# Patient Record
Sex: Male | Born: 1945 | Race: White | Hispanic: No | Marital: Married | Smoking: Never smoker
Health system: Southern US, Community
[De-identification: ages and names within clinical notes are randomized; demographics above are authoritative.]

## PROBLEM LIST (undated history)

## (undated) DIAGNOSIS — I10 Essential (primary) hypertension: Secondary | ICD-10-CM

## (undated) DIAGNOSIS — N200 Calculus of kidney: Secondary | ICD-10-CM

## (undated) HISTORY — PX: HOLEP-LASER ENUCLEATION OF THE PROSTATE WITH MORCELLATION: SHX6641

---

## 2014-10-23 HISTORY — PX: KIDNEY STONE SURGERY: SHX686

## 2016-12-16 ENCOUNTER — Emergency Department (HOSPITAL_BASED_OUTPATIENT_CLINIC_OR_DEPARTMENT_OTHER)
Admission: EM | Admit: 2016-12-16 | Discharge: 2016-12-16 | Disposition: A | Discharge status: Home / Self Care | Payer: Medicare Other | Attending: Emergency Medicine | Admitting: Emergency Medicine

## 2016-12-16 ENCOUNTER — Encounter (HOSPITAL_BASED_OUTPATIENT_CLINIC_OR_DEPARTMENT_OTHER): Payer: Self-pay | Admitting: Emergency Medicine

## 2016-12-16 DIAGNOSIS — R339 Retention of urine, unspecified: Secondary | ICD-10-CM | POA: Diagnosis not present

## 2016-12-16 DIAGNOSIS — I10 Essential (primary) hypertension: Secondary | ICD-10-CM | POA: Insufficient documentation

## 2016-12-16 DIAGNOSIS — R1030 Lower abdominal pain, unspecified: Secondary | ICD-10-CM | POA: Diagnosis not present

## 2016-12-16 HISTORY — DX: Calculus of kidney: N20.0

## 2016-12-16 HISTORY — DX: Essential (primary) hypertension: I10

## 2016-12-16 LAB — URINALYSIS, ROUTINE W REFLEX MICROSCOPIC
BILIRUBIN URINE: NEGATIVE
Glucose, UA: NEGATIVE mg/dL
Hgb urine dipstick: NEGATIVE
KETONES UR: 15 mg/dL — AB
LEUKOCYTES UA: NEGATIVE
NITRITE: NEGATIVE
PROTEIN: NEGATIVE mg/dL
Specific Gravity, Urine: 1.016 (ref 1.005–1.030)
pH: 5.5 (ref 5.0–8.0)

## 2016-12-16 NOTE — Discharge Instructions (Signed)
Please wear the leg bag. Motrin and Tylenol for pain. Follow-up with your urologist on Monday. Return to ED if you develop any worsening symptoms including fever, bloody urine discharge or for any reason.

## 2016-12-16 NOTE — ED Notes (Signed)
Pt given instructions as per chart.Verbalizes understanding. No questions. 

## 2016-12-16 NOTE — ED Provider Notes (Signed)
[facility]   CSN: [id number] Arrival date & time: 12/16/16  1520     History   Chief Complaint Chief Complaint  Patient presents with  . Urinary Retention    HPI Travis Mitchell is a 71 y.o. male.  HPI 71 year old Caucasian male with a past medical history significant for kidney stones and hypertension presents to the ED today with complaints of urinary retention. Patient states that he has not been able to urinate since 2100 last night. Only a few drops have been able to come out. He also endorses pressure over his bladder area. Thought that he was constipated and that was along him not to urinate. He states that he evacuated his bowels with mag citrate last night but still unable to urinate. Has never had this issue before. Patient states that he does have a high PSA with a slightly enlarged prostate and is followed by urology. Denies any issue urinating prior to this episode. Has not tried any further symptoms at home. Standing makes the bladder pressure improved. Denies any back pain, loss of bowel or bladder, saddle paresthesia, lower extremities paresthesias. Denies any fever, chills, nausea, emesis, abdominal pain, testicular pain, testicular swelling, penile discharge. Past Medical History:  Diagnosis Date  . Hypertension   . Kidney stone     There are no active problems to display for this patient.   Past Surgical History:  Procedure Laterality Date  . KIDNEY STONE SURGERY  2016   with stent       Home Medications    Prior to Admission medications   Medication Sig Start Date End Date Taking? Authorizing Provider  losartan (COZAAR) 25 MG tablet Take 20 mg by mouth daily.   Yes Historical Provider, MD    Family History No family history on file.  Social History Social History  Substance Use Topics  . Smoking status: Never Smoker  . Smokeless tobacco: Never Used  . Alcohol use Yes     Allergies   Penicillins   Review of  Systems Review of Systems  Constitutional: Negative for chills and fever.  Gastrointestinal: Positive for abdominal pain (superpubic). Negative for abdominal distention, constipation, diarrhea, nausea and vomiting.  Genitourinary: Positive for decreased urine volume, difficulty urinating, frequency and urgency. Negative for dysuria, hematuria, penile swelling, scrotal swelling and testicular pain.  Skin: Negative.   Neurological: Negative for dizziness, syncope, weakness, light-headedness and headaches.  All other systems reviewed and are negative.    Physical Exam Updated Vital Signs BP (!) 145/104 (BP Location: Left Arm) Comment: Didnt take BP medication today.   Pulse 94   Temp 98.1 F (36.7 C) (Oral)   Resp 19   Ht 5\' 6"  (1.676 m)   Wt 74.8 kg   SpO2 100%   BMI 26.63 kg/m   Physical Exam  Constitutional: He is oriented to person, place, and time. He appears well-developed and well-nourished. No distress.  Patient pacing in room due to pressure and bladder and feeling the urge to urinate.  HENT:  Head: Normocephalic and atraumatic.  Mouth/Throat: Oropharynx is clear and moist.  Eyes: Conjunctivae are normal. Right eye exhibits no discharge. Left eye exhibits no discharge. No scleral icterus.  Neck: Normal range of motion. Neck supple. No thyromegaly present.  Cardiovascular: Normal rate, regular rhythm, normal heart sounds and intact distal pulses.   Pulmonary/Chest: Effort normal and breath sounds normal.  Abdominal: Soft. Bowel sounds are normal. He exhibits no distension. There is tenderness in the suprapubic area.  There is no rigidity, no rebound, no guarding and no CVA tenderness.  Musculoskeletal: Normal range of motion.  No midline L-spine tenderness. No paraspinal tenderness.  Lymphadenopathy:    He has no cervical adenopathy.  Neurological: He is alert and oriented to person, place, and time.  Strength out of 5 in lower extremity. Sensation intact to sharp/dull. DP  pulses are 2+ bilaterally. Full range of motion. Patient with normal gait.  Skin: Skin is warm and dry. Capillary refill takes less than 2 seconds.  Nursing note and vitals reviewed.    ED Treatments / Results  Labs (all labs ordered are listed, but only abnormal results are displayed) Labs Reviewed - No data to display  EKG  EKG Interpretation None       Radiology No results found.  Procedures Procedures (including critical care time)  Medications Ordered in ED Medications - No data to display   Initial Impression / Assessment and Plan / ED Course  I have reviewed the triage vital signs and the nursing notes.  Pertinent labs & imaging results that were available during my care of the patient were reviewed by me and considered in my medical decision making (see chart for details).     Patient presents to the ED with complaints of urinary retention. Onset 2100 last night. Patient denies any fever, chills. Appears in mild discomfort due to bladder pressure urge to urinate. We'll obtain bladder scanner. We'll place Foley. Check UA for signs of infection.Bladder scan showed 817 of urine. Foley catheter was inserted. Complete resolution of patient's symptoms.100 return of urine. Urine without signs of infection. Patient denies any red flag symptoms concerning for cauda equina. Patient will be related normal gait. Patient feels significant relief in symptoms. We'll discharge with leg catheter and follow-up with his urologist on Monday. Symptoms are likely due to enlarged prostate. Patient is afebrile and normal urine. Low concern for prostatitis. No pain prior to discharge. Vital signs are stable. Not elevation of blood pressure with patient has history of hypertension and did not take meds today. Denies any symptoms. Pt is hemodynamically stable, in NAD, & able to ambulate in the ED. Pain has been managed & has no complaints prior to dc. Pt is comfortable with above plan and is stable  for discharge at this time. All questions were answered prior to disposition. Strict return precautions for f/u to the ED were discussed. Patient was discussed with Dr. Dalene SeltzerSchlossman who is agreeable to the above plan.    Final Clinical Impressions(s) / ED Diagnoses   Final diagnoses:  Urinary retention    New Prescriptions New Prescriptions   No medications on file     Rise MuKenneth T Leaphart, PA-C 12/16/16 1746    Alvira MondayErin Schlossman, MD 12/17/16 1352

## 2016-12-16 NOTE — ED Triage Notes (Signed)
Since 2100 last night has been unable to urinate other than few drops . Pressure to bladder area.

## 2020-08-19 ENCOUNTER — Other Ambulatory Visit: Payer: Self-pay | Admitting: Urology

## 2020-08-19 DIAGNOSIS — R972 Elevated prostate specific antigen [PSA]: Secondary | ICD-10-CM

## 2020-08-22 ENCOUNTER — Encounter (HOSPITAL_BASED_OUTPATIENT_CLINIC_OR_DEPARTMENT_OTHER): Payer: Self-pay | Admitting: Emergency Medicine

## 2020-08-22 ENCOUNTER — Other Ambulatory Visit: Payer: Self-pay

## 2020-08-22 ENCOUNTER — Emergency Department (HOSPITAL_BASED_OUTPATIENT_CLINIC_OR_DEPARTMENT_OTHER)
Admission: EM | Admit: 2020-08-22 | Discharge: 2020-08-22 | Disposition: A | Discharge status: Home / Self Care | Payer: Medicare Other | Attending: Emergency Medicine | Admitting: Emergency Medicine

## 2020-08-22 DIAGNOSIS — R339 Retention of urine, unspecified: Secondary | ICD-10-CM | POA: Diagnosis not present

## 2020-08-22 DIAGNOSIS — I10 Essential (primary) hypertension: Secondary | ICD-10-CM | POA: Insufficient documentation

## 2020-08-22 DIAGNOSIS — Z79899 Other long term (current) drug therapy: Secondary | ICD-10-CM | POA: Diagnosis not present

## 2020-08-22 LAB — URINALYSIS, MICROSCOPIC (REFLEX)

## 2020-08-22 LAB — URINALYSIS, ROUTINE W REFLEX MICROSCOPIC
Bilirubin Urine: NEGATIVE
Glucose, UA: NEGATIVE mg/dL
Ketones, ur: NEGATIVE mg/dL
Leukocytes,Ua: NEGATIVE
Nitrite: NEGATIVE
Protein, ur: NEGATIVE mg/dL
Specific Gravity, Urine: 1.01 (ref 1.005–1.030)
pH: 5.5 (ref 5.0–8.0)

## 2020-08-22 NOTE — ED Notes (Signed)
Pt. Was instructed on foley care, changing from leg bag to standard bag for night coverage.  Gave different leg strap for comfort. Pt understood education.

## 2020-08-22 NOTE — ED Provider Notes (Signed)
[facility] EMERGENCY DEPARTMENT Provider Note   CSN: [id number] Arrival date & time: 08/22/20  5397     History Chief Complaint  Patient presents with  . Urinary Retention    Travis Mitchell is a 74 y.o. male.  HPI    74yo male with history of hypertension, nephrolithiasis, BPH with obstructive symptoms and elevated PSA seeing Dr. Pete Glatter Urology with recent diagnosis of UTI presents with concern for urinary retention.   4AM could not get past prostate, ended up with blood on catheter and stopped  Diagnosed with UTI Friday, started Cipro has had 5 pills, Yesterday in afternoon was urinating a little bit but last night was unable to urinate Hx of prostate enlargement, sees Dr. Pete Glatter, previous self-cath and had some supplies so self catheterized 11PM and had clear urine and a lot of output. Then overnight had bladder spasms and pain and unable to urinate, attempted self cath at 4AM but only was able to get blood. Now feels like needs to urinate but cannot and having pain Severe pain   Past Medical History:  Diagnosis Date  . Hypertension   . Kidney stone     There are no problems to display for this patient.   Past Surgical History:  Procedure Laterality Date  . KIDNEY STONE SURGERY  2016   with stent       No family history on file.  Social History   Tobacco Use  . Smoking status: Never Smoker  . Smokeless tobacco: Never Used  Substance Use Topics  . Alcohol use: Yes  . Drug use: No    Home Medications Prior to Admission medications   Medication Sig Start Date End Date Taking? Authorizing Provider  losartan (COZAAR) 25 MG tablet Take 20 mg by mouth daily.    [provider]    Allergies    Penicillins  Review of Systems   Review of Systems  Constitutional: Negative for fever.  Respiratory: Negative for cough.   Cardiovascular: Negative for chest pain.  Gastrointestinal: Positive for abdominal pain. Negative for nausea  and vomiting.  Genitourinary: Positive for difficulty urinating and frequency.  Musculoskeletal: Positive for back pain.    Physical Exam Updated Vital Signs BP (!) 177/74 (BP Location: Left Arm)   Pulse 99   Temp 98 F (36.7 C) (Oral)   Resp 18   Ht 5\' 6"  (1.676 m)   Wt 74.8 kg   SpO2 100%   BMI 26.63 kg/m   Physical Exam Vitals and nursing note reviewed.  Constitutional:      General: He is not in acute distress.    Appearance: Normal appearance. He is not ill-appearing, toxic-appearing or diaphoretic.  HENT:     Head: Normocephalic.  Eyes:     Conjunctiva/sclera: Conjunctivae normal.  Cardiovascular:     Rate and Rhythm: Normal rate and regular rhythm.     Pulses: Normal pulses.  Pulmonary:     Effort: Pulmonary effort is normal. No respiratory distress.  Abdominal:     Tenderness: There is abdominal tenderness (suprapubic).  Musculoskeletal:        General: No deformity or signs of injury.     Cervical back: No rigidity.  Skin:    General: Skin is warm and dry.     Coloration: Skin is not jaundiced or pale.  Neurological:     General: No focal deficit present.     Mental Status: He is alert and oriented to person, place, and time.  ED Results / Procedures / Treatments   Labs (all labs ordered are listed, but only abnormal results are displayed) Labs Reviewed  URINE CULTURE  URINALYSIS, ROUTINE W REFLEX MICROSCOPIC    EKG None  Radiology No results found.  Procedures Procedures (including critical care time)  Medications Ordered in ED Medications - No data to display  ED Course  I have reviewed the triage vital signs and the nursing notes.  Pertinent labs & imaging results that were available during my care of the patient were reviewed by me and considered in my medical decision making (see chart for details).    MDM Rules/Calculators/A&P                          74yo male with history of hypertension, nephrolithiasis, BPH with  obstructive symptoms and elevated PSA seeing Dr. Pete Glatter Urology with recent diagnosis of UTI presents with concern for urinary retention.  Was unable to self cath this AM. History and bladder scan consistent with urinary retention. Recent Cr 1 one month ago-doubt acute changes given less than 24 hr of obstriction and discussed  With patient do not feel labs will change current course of care.  No signs of sepsis, AAA, nephrolithiasis or other intraabdominal process.   Reviewed UA from urgent care, will send urine culture. Unclear if initial symptoms infection or obstructive.    Placed foley catheter and recommend close outpatient follow up with Urology. Patient discharged in stable condition with understanding of reasons to return.     Final Clinical Impression(s) / ED Diagnoses Final diagnoses:  Urinary retention    Rx / DC Orders ED Discharge Orders    None       Alvira Monday, MD 08/22/20 [phone]

## 2020-08-22 NOTE — ED Triage Notes (Signed)
Pt dx with UTI on Friday and started on cipro. States he has been unable to urinate as well and had leftover supplies so he self catheterized last night and this morning. He states he had difficulty due to enlarged prostate.

## 2020-08-22 NOTE — ED Notes (Signed)
 bladder scan

## 2020-08-22 NOTE — ED Notes (Signed)
Placed 14 Fr. Catheter, due to previous self cath at home, some blood return from that experience, pt. Was very sore coming into to the Ed today as a result.  Pt. Has hx of swollen prostate gland and is followed with urology already.

## 2020-08-23 ENCOUNTER — Ambulatory Visit: Payer: Self-pay

## 2020-08-23 LAB — URINE CULTURE: Culture: NO GROWTH

## 2020-08-23 NOTE — Telephone Encounter (Signed)
Pt. Had a urinary catheter placed yesterday in ED. Reports he is having spasms. Catheter is draining well and is not kinked. Instructed to call his urologist. Trenton Gammon understanding. Answer Assessment - Initial Assessment Questions 1. SYMPTOMS: "What symptoms are you concerned about?"     Spasms 2. ONSET:  "When did the symptoms start?"     Yesterday 3. FEVER: "Is there a fever?" If Yes, ask: "What is the temperature, how was it measured, and when did it start?"     No 4. ABDOMINAL PAIN: "Is there any abdominal pain?" (e.g., Scale 1-10; or mild, moderate, severe)     No 5. URINE COLOR: "What color is the urine?"  "Is there blood present in the urine?" (e.g., clear, yellow, cloudy, tea-colored, blood streaks, bright red)     Yellow 6. ONSET: "When was the catheter inserted?"     Yesterday 7. OTHER SYMPTOMS: "Do you have any other symptoms?" (e.g., back pain, bad urine odor)      No 8. PREGNANCY: "Is there any chance you are pregnant?" "When was your last menstrual period?"     n/a  Protocols used: URINARY CATHETER SYMPTOMS AND QUESTIONS-A-AH

## 2020-09-04 ENCOUNTER — Encounter (HOSPITAL_BASED_OUTPATIENT_CLINIC_OR_DEPARTMENT_OTHER): Payer: Self-pay

## 2020-09-04 ENCOUNTER — Emergency Department (HOSPITAL_BASED_OUTPATIENT_CLINIC_OR_DEPARTMENT_OTHER)
Admission: EM | Admit: 2020-09-04 | Discharge: 2020-09-04 | Disposition: A | Discharge status: Home / Self Care | Payer: Medicare Other | Attending: Emergency Medicine | Admitting: Emergency Medicine

## 2020-09-04 ENCOUNTER — Other Ambulatory Visit: Payer: Self-pay

## 2020-09-04 DIAGNOSIS — R339 Retention of urine, unspecified: Secondary | ICD-10-CM | POA: Diagnosis present

## 2020-09-04 DIAGNOSIS — I10 Essential (primary) hypertension: Secondary | ICD-10-CM | POA: Insufficient documentation

## 2020-09-04 DIAGNOSIS — N401 Enlarged prostate with lower urinary tract symptoms: Secondary | ICD-10-CM | POA: Insufficient documentation

## 2020-09-04 DIAGNOSIS — Z79899 Other long term (current) drug therapy: Secondary | ICD-10-CM | POA: Diagnosis not present

## 2020-09-04 LAB — URINALYSIS, ROUTINE W REFLEX MICROSCOPIC
Bilirubin Urine: NEGATIVE
Glucose, UA: NEGATIVE mg/dL
Ketones, ur: NEGATIVE mg/dL
Leukocytes,Ua: NEGATIVE
Nitrite: NEGATIVE
Protein, ur: NEGATIVE mg/dL
Specific Gravity, Urine: 1.02 (ref 1.005–1.030)
pH: 6 (ref 5.0–8.0)

## 2020-09-04 LAB — URINALYSIS, MICROSCOPIC (REFLEX)

## 2020-09-04 NOTE — ED Provider Notes (Signed)
[facility] EMERGENCY DEPARTMENT Provider Note   CSN: [id number] Arrival date & time: 09/04/20  9179     History Chief Complaint  Patient presents with  . Urinary Retention    Travis Mitchell is a 74 y.o. male.  Pt presents to the ED today with difficulty urinating.  Pt has a hx of BPH and urinary retention and is followed by Dr. Pete Glatter [facility]).  He was diagnosed with a UTI on 10/29 and was put on cipro.  He has self-cathed in the past.  He came to the ED on 10/31 for urinary retention and a foley was placed.  Urine culture showed no growth.  Pt saw Dr. Pete Glatter on 11/9.  The foley was removed.  He had a voiding trial and did well.  He was to continue alfuzosin and resume dutasteride.  He was given bactrim to take for 3 days following catheter removal.  Pt said he was ok until yesterday when he was unable to urinate.  He also could not get his catheter past his prostate.             Past Medical History:  Diagnosis Date  . Hypertension   . Kidney stone     There are no problems to display for this patient.   Past Surgical History:  Procedure Laterality Date  . KIDNEY STONE SURGERY  2016   with stent       No family history on file.  Social History   Tobacco Use  . Smoking status: Never Smoker  . Smokeless tobacco: Never Used  Substance Use Topics  . Alcohol use: Yes  . Drug use: No    Home Medications Prior to Admission medications   Medication Sig Start Date End Date Taking? Authorizing Provider  losartan (COZAAR) 25 MG tablet Take 20 mg by mouth daily.    [provider]    Allergies    Penicillins  Review of Systems   Review of Systems  Genitourinary: Positive for difficulty urinating.  All other systems reviewed and are negative.   Physical Exam Updated Vital Signs BP (!) 146/61 (BP Location: Right Arm)   Pulse 82   Temp 98 F (36.7 C) (Oral)   Resp 16   Ht 5\' 6"  (1.676 m)   Wt 74.9 kg   SpO2 98%    BMI 26.65 kg/m   Physical Exam Vitals and nursing note reviewed.  Constitutional:      Appearance: Normal appearance.  HENT:     Head: Normocephalic and atraumatic.     Right Ear: External ear normal.     Left Ear: External ear normal.     Nose: Nose normal.     Mouth/Throat:     Mouth: Mucous membranes are moist.     Pharynx: Oropharynx is clear.  Eyes:     Extraocular Movements: Extraocular movements intact.     Conjunctiva/sclera: Conjunctivae normal.     Pupils: Pupils are equal, round, and reactive to light.  Cardiovascular:     Rate and Rhythm: Normal rate and regular rhythm.     Pulses: Normal pulses.     Heart sounds: Normal heart sounds.  Pulmonary:     Effort: Pulmonary effort is normal.     Breath sounds: Normal breath sounds.  Abdominal:     General: Abdomen is flat. Bowel sounds are normal.     Palpations: Abdomen is soft.     Tenderness: There is abdominal tenderness in the suprapubic area.  Musculoskeletal:  General: Normal range of motion.     Cervical back: Normal range of motion and neck supple.  Skin:    General: Skin is warm.     Capillary Refill: Capillary refill takes less than 2 seconds.  Neurological:     General: No focal deficit present.     Mental Status: He is alert and oriented to person, place, and time.  Psychiatric:        Mood and Affect: Mood normal.        Behavior: Behavior normal.        Thought Content: Thought content normal.        Judgment: Judgment normal.     ED Results / Procedures / Treatments   Labs (all labs ordered are listed, but only abnormal results are displayed) Labs Reviewed  URINALYSIS, ROUTINE W REFLEX MICROSCOPIC - Abnormal; Notable for the following components:      Result Value   Hgb urine dipstick SMALL (*)    All other components within normal limits  URINALYSIS, MICROSCOPIC (REFLEX) - Abnormal; Notable for the following components:   Bacteria, UA MANY (*)    All other components within normal  limits  URINE CULTURE    EKG None  Radiology No results found.  Procedures Procedures (including critical care time)  Medications Ordered in ED Medications - No data to display  ED Course  I have reviewed the triage vital signs and the nursing notes.  Pertinent labs & imaging results that were available during my care of the patient were reviewed by me and considered in my medical decision making (see chart for details).    MDM Rules/Calculators/A&P                          Pt had a 14 F foley placed with 600 cc urine return.  Pt is feeling much better.  UA neg.  Urine sent for cx.  Pt to f/u with Dr. Pete Glatter.  Return if worse.   Final Clinical Impression(s) / ED Diagnoses Final diagnoses:  Urinary retention  Benign prostatic hyperplasia with urinary retention    Rx / DC Orders ED Discharge Orders    None       Jacalyn Lefevre, MD 09/04/20 1106

## 2020-09-04 NOTE — ED Triage Notes (Signed)
Pt arrives with reports of trouble urinating states that he recently had a catheter in place which his urologist took out. Pt states that he usually self caths at home but has had trouble with last "full emptying" on yesterday morning. Reports minimal urine out.

## 2020-09-04 NOTE — Discharge Instructions (Signed)
Continue alfuzosin and dutasteride 0.5 mg daily.

## 2020-09-04 NOTE — ED Notes (Signed)
Tammy Sours, our tech. Has just inserted a #14 foley without incident, which returns clear, light yellow urine.

## 2020-09-05 LAB — URINE CULTURE: Culture: NO GROWTH

## 2020-09-12 ENCOUNTER — Ambulatory Visit
Admission: RE | Admit: 2020-09-12 | Discharge: 2020-09-12 | Disposition: A | Discharge status: Home / Self Care | Payer: Medicare Other | Source: Ambulatory Visit | Attending: Urology | Admitting: Urology

## 2020-09-12 DIAGNOSIS — R972 Elevated prostate specific antigen [PSA]: Secondary | ICD-10-CM

## 2020-09-12 MED ORDER — GADOBENATE DIMEGLUMINE 529 MG/ML IV SOLN
15.0000 mL | Freq: Once | INTRAVENOUS | Status: AC | PRN
  Administered 2020-09-12: 15 mL via INTRAVENOUS

## 2022-06-21 IMAGING — MR MR PROSTATE WO/W CM
12 series · 48 of 48 positions shown · IV contrast (multihance)
Comparison: Prostate MRI from 6162

CLINICAL DATA: History of elevated PSA. Signs of BPH on biopsy with
no evidence of malignancy by report on biopsy from 8604.

EXAM:
MR PROSTATE WITHOUT AND WITH CONTRAST
TECHNIQUE: Multiplanar multisequence MRI images were obtained of the pelvis
centered about the prostate. Pre and post contrast images were
obtained.
CONTRAST:  15mL MULTIHANCE GADOBENATE DIMEGLUMINE 529 MG/ML IV SOLN

[Series 3: T2 · coronal · 3.0mm · 0.56mm/px · 1 of 23 slices shown (1 of 3)]
[im 1/23]
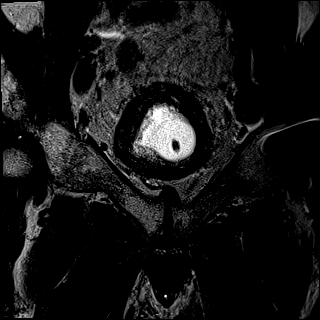

[Series 4: T1 · axial · 5.0mm · 1.25mm/px · 1 of 104 slices shown]
[im 1/104]
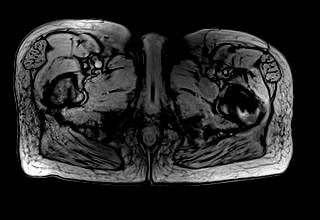

[Series 5: DWI · axial · 3.0mm · 1.75mm/px · 1 of 84 slices shown (1 of 3)]
[im 1/84]
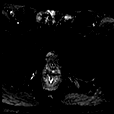

[Series 6: DWI · axial · 3.0mm · 1.75mm/px · 1 of 30 slices shown (2 of 3)]
[im 1/30]
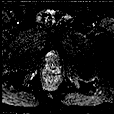

[Series 7: DWI · axial · 3.0mm · 1.75mm/px · 1 of 27 slices shown (3 of 3)]
[im 1/27]
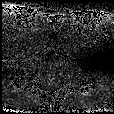

[Series 8: T2 · axial · 3.0mm · 0.56mm/px · 1 of 33 slices shown (2 of 3)]
[im 1/33]
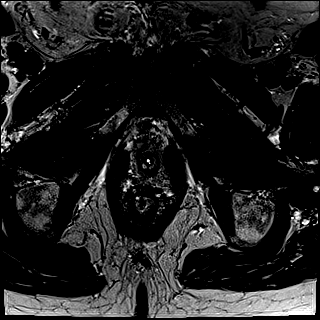

[Series 9: T2 · axial · 1.0mm · 1.04mm/px · 1 of 104 slices shown (3 of 3)]
[im 1/104]
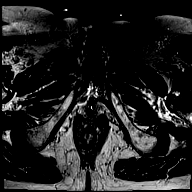

[Series 10: pre t1_twist_tra_dyn · axial · non-contrast · 3.5mm · 0.83mm/px · 1 of 30 slices shown]
[im 1/30]
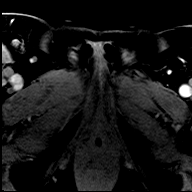

[Series 11: post t1_twist_tra_dyn-copy center · axial · non-contrast · 3.5mm · 0.83mm/px · z∈[-2,+100]mm · 18 of 900 slices shown]
[im 1/900]
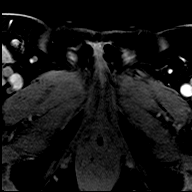
[im 53/900]
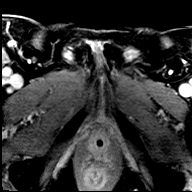
[im 106/900]
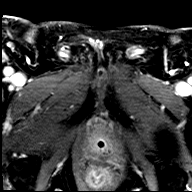
[im 159/900]
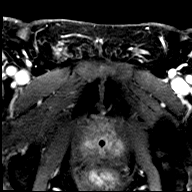
[im 212/900]
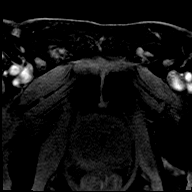
[im 265/900]
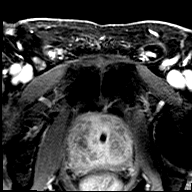
[im 318/900]
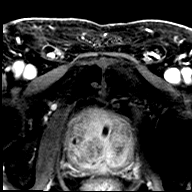
[im 371/900]
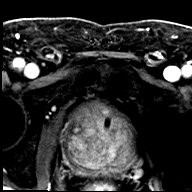
[im 424/900]
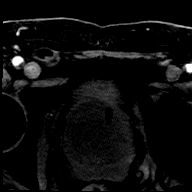
[im 476/900]
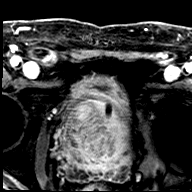
[im 529/900]
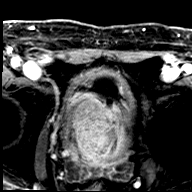
[im 582/900]
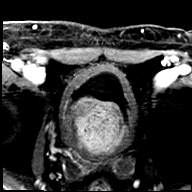
[im 635/900]
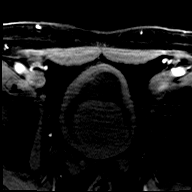
[im 688/900]
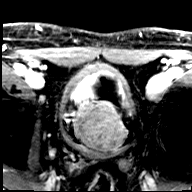
[im 741/900]
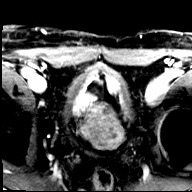
[im 794/900]
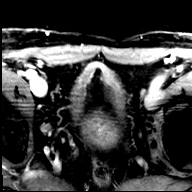
[im 847/900]
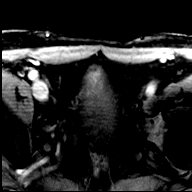
[im 900/900]
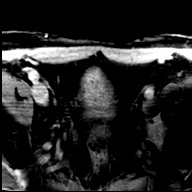

[Series 12: post t1_twist_tra_dyn-copy cent_sub · axial · 3.5mm · 0.83mm/px · z∈[-2,+100]mm · 18 of 870 slices shown]
[im 1/870]
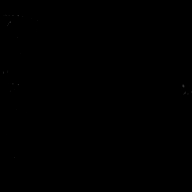
[im 52/870]
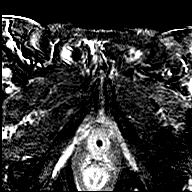
[im 103/870]
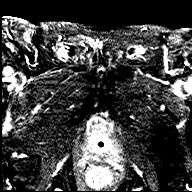
[im 154/870]
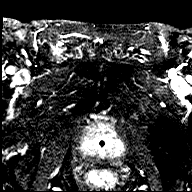
[im 205/870]
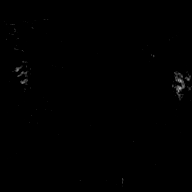
[im 256/870]
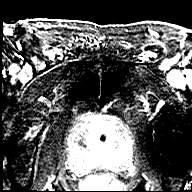
[im 307/870]
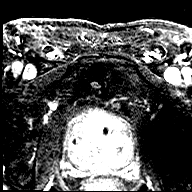
[im 358/870]
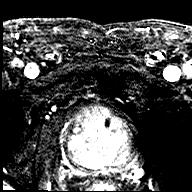
[im 409/870]
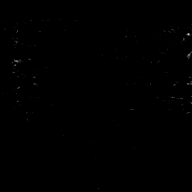
[im 461/870]
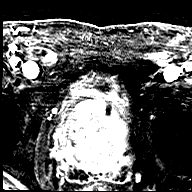
[im 512/870]
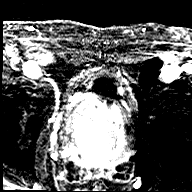
[im 563/870]
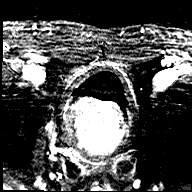
[im 614/870]
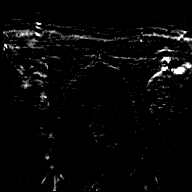
[im 665/870]
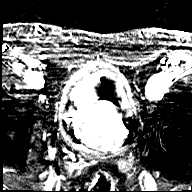
[im 716/870]
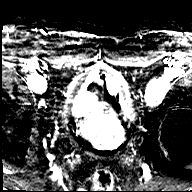
[im 767/870]
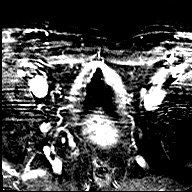
[im 818/870]
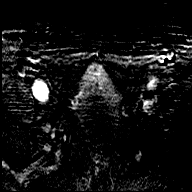
[im 870/870]
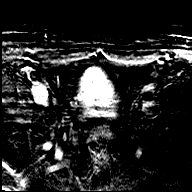

[Series 13: t1_vibe_dixon_tra_f · axial · 2.5mm · 0.91mm/px · z∈[+14,+212]mm · 2 of 80 slices shown]
[im 1/80]
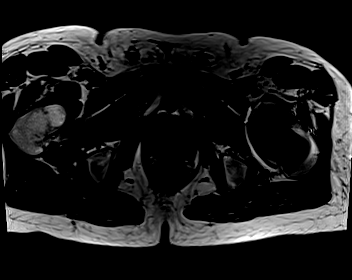
[im 80/80]
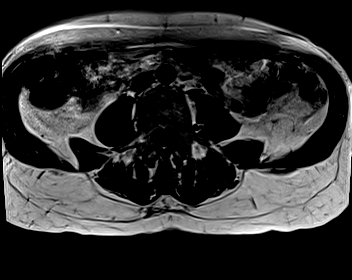

[Series 14: t1_vibe_dixon_tra_w · axial · 2.5mm · 0.91mm/px · z∈[+14,+212]mm · 2 of 80 slices shown]
[im 1/80]
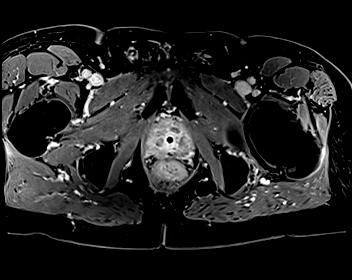
[im 80/80]
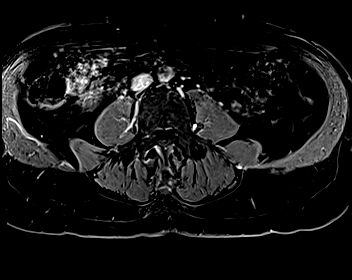

[48 of 48 positions shown; findings below may reference images not displayed]

FINDINGS: Prostate: Prostate is markedly enlarged compatible with BPH with
expansion of the transitional zone and hypertrophy of the median
lobe which extends into the bladder base filling much of the urinary
bladder.

Transitional zone: No high-risk lesion in the transitional zone.

Peripheral zone: Thinning of the peripheral zone in this patient
with marked BPH. No high-risk lesion in the peripheral zone.
Assessment on the LEFT is limited by susceptibility artifact on
diffusion-weighted imaging from LEFT hip arthroplasty. T2 images
without suspicious focal abnormality with areas of linear and
wedge-shaped T2 hypointensity, subtle throughout the peripheral
gland.

Volume: 5.9 x 5.7 x 8.3 (volume = 150 cc) cm

Transcapsular spread:  Absent

Seminal vesicle involvement: Absent

Neurovascular bundle involvement: Absent

Pelvic adenopathy: Absent

Bone metastasis: Absent with changes of LEFT hip arthroplasty
causing susceptibility artifact about the LEFT pelvis.

Other findings: LEFT hip arthroplasty as discussed. Foley catheter
traverses the markedly enlarged prostate extending into the urinary
bladder. Urinary bladder is under distended.
IMPRESSION: No signs of high-risk lesion in the prostate. Overall assessment is
PIRADS category numeral 2, with changes of extensive BPH. Study
mildly limited in the LEFT gland secondary to susceptibility
artifact from LEFT hip arthroplasty.

## 2022-10-07 ENCOUNTER — Encounter: Payer: Self-pay | Admitting: Urology

## 2022-10-19 ENCOUNTER — Ambulatory Visit (INDEPENDENT_AMBULATORY_CARE_PROVIDER_SITE_OTHER): Payer: Medicare Other | Admitting: Urology

## 2022-10-19 ENCOUNTER — Encounter: Payer: Self-pay | Admitting: Urology

## 2022-10-19 VITALS — BP 157/75 | HR 60 | Ht 66.0 in | Wt 170.0 lb

## 2022-10-19 DIAGNOSIS — R972 Elevated prostate specific antigen [PSA]: Secondary | ICD-10-CM

## 2022-10-19 DIAGNOSIS — Z87898 Personal history of other specified conditions: Secondary | ICD-10-CM

## 2022-10-19 DIAGNOSIS — N529 Male erectile dysfunction, unspecified: Secondary | ICD-10-CM

## 2022-10-19 DIAGNOSIS — N401 Enlarged prostate with lower urinary tract symptoms: Secondary | ICD-10-CM

## 2022-10-19 DIAGNOSIS — N138 Other obstructive and reflux uropathy: Secondary | ICD-10-CM | POA: Diagnosis not present

## 2022-10-19 LAB — URINALYSIS
Bilirubin, UA: NEGATIVE
Blood, UA: NEGATIVE
Glucose, UA: NEGATIVE mg/dL
Ketones, UA: NEGATIVE
Leukocytes, UA: NEGATIVE
Nitrite, UA: NEGATIVE
Protein, UA: NEGATIVE
Spec Grav, UA: 1.02 (ref 1.010–1.025)
Urobilinogen, UA: 0.2 E.U./dL
pH, UA: 7 (ref 5.0–8.0)

## 2022-10-19 NOTE — Progress Notes (Signed)
Assessment: 1. BPH with obstruction/lower urinary tract symptoms   2. History of elevated PSA   3. Organic impotence     Plan: I reviewed the patient records from [facility] including office notes and lab results. PSA today. Return to office in 1 year.  Chief Complaint:  Chief Complaint  Patient presents with   Benign Prostatic Hypertrophy    History of Present Illness:  Travis Mitchell is a 76 y.o. male who is seen for continued evaluation of BPH with obstruction and lower urinary tract symptoms, microscopic hematuria, elevated PSA, and organic impotence.  He was previously followed at [facility] in [facility] and was last seen there in April 2022.  Urologic History: He had a episode of urinary retention in 2/18. He was started on alfuzosin and passed a voiding trial. Urine culture grew >100K Lactobacillus.   He has a history of elevated PSA. He is been followed for an elevated PSA and is status post several prostate biopsies. 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 His prostate biopsy in 7/11 for PSA of 17.45 showed BPH with no malignancy. His PSA has been as high as 17-18. He was evaluated with an MRI in January 2015 which showed no obvious abnormalities within the prostate. His PSA was 12.59 with 15% free from 06/15/14. His PSA from 1/16 was 17.58. 4K test from 10/16 showed PSA of 13.63, 16% free PSA, and 22% probability of aggressive prostate cancer on biopsy. He underwent a prostate biopsy on 09/07/15. Prostate volume measured 125 mL. Pathology showed benign prostatic tissue, no evidence of malignancy. 4K test from 4/18 showed a PSA of 21.09, free PSA 18%, and 38% probability of aggressive prostate cancer on biopsy. He underwent MRI of the prostate in May 2018 which showed no suspicious lesions.  He was started on finasteride. PSA from 3/19 was 10.6 with 21% free. PSA from 12/19 was 5.15 (11.02 corrected) with 17% free. PSA  from 7/20 was 6.97 (13.9 corrected) with 18% free. PSA from 1/21 was 12.29 with 18% free. 4K test from 07/08/2020 showed a PSA of 28.33 with 11% free PSA and a 31% probability of aggressive prostate cancer on biopsy.  MRI from 09/12/2020 showed a markedly enlarged prostate compatible with BPH with a large median lobe extending into the bladder, no signs of high risk lesions within the prostate. Prostate volume measures 150 mL.  He continued on alfuzosin and finasteride. AUA score = 3. U/A from 2/19 showed >30 RBCs. Repeat U/A negative. Recent U/A from 5/19 showed 23-30 RBCs. He underwent a hematuria evaluation in 6/19. CT imaging showed a 6 mm left distal ureteral calculus without obstruction and a 2 mm right renal calculus. Cystoscopy showed lateral lobe enlargement of the prostate and no bladder abnormalities. He underwent a left ureteroscopic stone manipulation with laser lithotripsy in [place] on 04/29/2018. He developed acute urinary retention postoperatively requiring Foley catheter placement. He did require self intermittent catheterization for a short period of time but was eventually voiding spontaneously. He discontinued catheterizing.   He discontinued the finasteride in May due to gynecomastia. His gynecomastia resolved.  At his visit in 9/21, he continued on alfuzosin but reported some slight worsening of his symptoms with increased frequency. No dysuria or gross hematuria. AUA score = 5. He was diagnosed with a UTI on 08/20/2020. He was started on Cipro. Urine culture showed no growth. He presented to the emergency room on 08/22/2020 with difficulty voiding. He did  attempt self-catheterization with some bleeding. Bladder scan showed 568 mL in the bladder. A Foley catheter was placed. Urine culture showed no growth. His catheter was removed after a successful voiding trial on 08/28/2020. He was seen in the emergency room on 09/04/2020 unable to void and unable to catheterize himself. A  Foley catheter was placed with return of 600 mL.  He underwent a HoLEP on 11/11/2020 at the [facility]. Pathology showed single focus of atypical glands, no malignancy. PSA from 3/22: 0.5.  He was doing very well since the procedure with significant improvement in his flow. AUA score = 2  SHIM = 3  Uroflow from 02/18/2021 showed a voided volume of 98 mL with a max flow rate of 16 mL/s and a residual of 18 mL.  He returns today for follow-up.  He is doing very well from a urinary standpoint.  He is not having any significant urinary symptoms.  No dysuria or gross hematuria. IPSS = 0 today. He reports occasional erections.  He is not interested in any treatment at this time.  Portions of the above documentation were copied from a prior visit for review purposes only.   Past Medical History:  Past Medical History:  Diagnosis Date   Hypertension    Kidney stone     Past Surgical History:  Past Surgical History:  Procedure Laterality Date   HOLEP-LASER ENUCLEATION OF THE PROSTATE WITH MORCELLATION     KIDNEY STONE SURGERY  2016   with stent    Allergies:  Allergies  Allergen Reactions   Penicillins Hives    Family History:  No family history on file.  Social History:  Social History   Tobacco Use   Smoking status: Never   Smokeless tobacco: Never  Substance Use Topics   Alcohol use: Yes   Drug use: No    Review of symptoms:  Constitutional:  Negative for unexplained weight loss, night sweats, fever, chills ENT:  Negative for nose bleeds, sinus pain, painful swallowing CV:  Negative for chest pain, shortness of breath, exercise intolerance, palpitations, loss of consciousness Resp:  Negative for cough, wheezing, shortness of breath GI:  Negative for nausea, vomiting, diarrhea, bloody stools GU:  Positives noted in HPI; otherwise negative for gross hematuria, dysuria, urinary incontinence Neuro:  Negative for seizures, poor balance, limb weakness, slurred  speech Psych:  Negative for lack of energy, depression, anxiety Endocrine:  Negative for polydipsia, polyuria, symptoms of hypoglycemia (dizziness, hunger, sweating) Hematologic:  Negative for anemia, purpura, petechia, prolonged or excessive bleeding, use of anticoagulants  Allergic:  Negative for difficulty breathing or choking as a result of exposure to anything; no shellfish allergy; no allergic response (rash/itch) to materials, foods  Physical exam: BP (!) 157/75   Pulse 60   Ht 5\' 6"  (1.676 m)   Wt 170 lb (77.1 kg)   BMI 27.44 kg/m  GENERAL APPEARANCE:  Well appearing, well developed, well nourished, NAD HEENT: Atraumatic, Normocephalic, oropharynx clear. NECK: Supple without lymphadenopathy or thyromegaly. LUNGS: Clear to auscultation bilaterally. HEART: Regular Rate and Rhythm without murmurs, gallops, or rubs. ABDOMEN: Soft, non-tender, No Masses. EXTREMITIES: Moves all extremities well.  Without clubbing, cyanosis, or edema. NEUROLOGIC:  Alert and oriented x 3, normal gait, CN II-XII grossly intact.  MENTAL STATUS:  Appropriate. BACK:  Non-tender to palpation.  No CVAT SKIN:  Warm, dry and intact.   GU: Prostate: 30 g, NT, no nodules Rectum: Normal tone,  no masses or tenderness   Results: Results for orders  placed or performed in visit on 10/19/22 (from the past 24 hour(s))  Urinalysis     Status: None   Collection Time: 10/19/22 12:00 AM  Result Value Ref Range   Glucose, UA negative negative mg/dL   Bilirubin, UA negative    Ketones, UA negative    Spec Grav, UA 1.020 1.010 - 1.025   Blood, UA negative    pH, UA 7.0 5.0 - 8.0   Protein, UA Negative Negative   Urobilinogen, UA 0.2 0.2 or 1.0 E.U./dL   Nitrite, UA negative    Leukocytes, UA Negative Negative

## 2022-10-20 ENCOUNTER — Encounter: Payer: Self-pay | Admitting: Urology

## 2022-10-20 LAB — PSA: Prostate Specific Ag, Serum: 2.3 ng/mL (ref 0.0–4.0)

## 2022-10-26 ENCOUNTER — Ambulatory Visit: Payer: Medicare Other | Admitting: Urology

## 2023-09-25 ENCOUNTER — Ambulatory Visit (INDEPENDENT_AMBULATORY_CARE_PROVIDER_SITE_OTHER): Payer: Medicare Other | Admitting: Urology

## 2023-09-25 ENCOUNTER — Encounter: Payer: Self-pay | Admitting: Urology

## 2023-09-25 VITALS — BP 156/79 | HR 63 | Ht 66.0 in | Wt 170.0 lb

## 2023-09-25 DIAGNOSIS — N138 Other obstructive and reflux uropathy: Secondary | ICD-10-CM

## 2023-09-25 DIAGNOSIS — N401 Enlarged prostate with lower urinary tract symptoms: Secondary | ICD-10-CM | POA: Diagnosis not present

## 2023-09-25 DIAGNOSIS — Z87898 Personal history of other specified conditions: Secondary | ICD-10-CM | POA: Diagnosis not present

## 2023-09-25 LAB — URINALYSIS, ROUTINE W REFLEX MICROSCOPIC
Bilirubin, UA: NEGATIVE
Glucose, UA: NEGATIVE
Ketones, UA: NEGATIVE
Leukocytes,UA: NEGATIVE
Nitrite, UA: NEGATIVE
Protein,UA: NEGATIVE
RBC, UA: NEGATIVE
Specific Gravity, UA: 1.015 (ref 1.005–1.030)
Urobilinogen, Ur: 0.2 mg/dL (ref 0.2–1.0)
pH, UA: 7.5 (ref 5.0–7.5)

## 2023-09-25 NOTE — Progress Notes (Signed)
Assessment: 1. BPH with obstruction/lower urinary tract symptoms   2. History of elevated PSA     Plan: PSA today. Return to office in 1 year.  Chief Complaint:  Chief Complaint  Patient presents with   Benign Prostatic Hypertrophy    History of Present Illness:  Travis Mitchell is a 77 y.o. male who is seen for continued evaluation of BPH with obstruction and lower urinary tract symptoms, microscopic hematuria, elevated PSA, and organic impotence.    Urologic History: He had a episode of urinary retention in 2/18. He was started on alfuzosin and passed a voiding trial. Urine culture grew >100K Lactobacillus.   He has a history of elevated PSA. He is been followed for an elevated PSA and is status post several prostate biopsies. 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 His prostate biopsy in 7/11 for PSA of 17.45 showed BPH with no malignancy. His PSA has been as high as 17-18. He was evaluated with an MRI in January 2015 which showed no obvious abnormalities within the prostate. His PSA was 12.59 with 15% free from 06/15/14. His PSA from 1/16 was 17.58. 4K test from 10/16 showed PSA of 13.63, 16% free PSA, and 22% probability of aggressive prostate cancer on biopsy. He underwent a prostate biopsy on 09/07/15. Prostate volume measured 125 mL. Pathology showed benign prostatic tissue, no evidence of malignancy. 4K test from 4/18 showed a PSA of 21.09, free PSA 18%, and 38% probability of aggressive prostate cancer on biopsy. He underwent MRI of the prostate in May 2018 which showed no suspicious lesions.  He was started on finasteride. PSA from 3/19 was 10.6 with 21% free. PSA from 12/19 was 5.15 (11.02 corrected) with 17% free. PSA from 7/20 was 6.97 (13.9 corrected) with 18% free. PSA from 1/21 was 12.29 with 18% free. 4K test from 07/08/2020 showed a PSA of 28.33 with 11% free PSA and a 31% probability of aggressive prostate cancer on biopsy.  MRI from  09/12/2020 showed a markedly enlarged prostate compatible with BPH with a large median lobe extending into the bladder, no signs of high risk lesions within the prostate. Prostate volume measures 150 mL.  He continued on alfuzosin and finasteride. AUA score = 3. U/A from 2/19 showed >30 RBCs. Repeat U/A negative. Recent U/A from 5/19 showed 23-30 RBCs. He underwent a hematuria evaluation in 6/19. CT imaging showed a 6 mm left distal ureteral calculus without obstruction and a 2 mm right renal calculus. Cystoscopy showed lateral lobe enlargement of the prostate and no bladder abnormalities. He underwent a left ureteroscopic stone manipulation with laser lithotripsy in [place] on 04/29/2018. He developed acute urinary retention postoperatively requiring Foley catheter placement. He did require self intermittent catheterization for a short period of time but was eventually voiding spontaneously. He discontinued catheterizing.   He discontinued the finasteride in May due to gynecomastia. His gynecomastia resolved.  At his visit in 9/21, he continued on alfuzosin but reported some slight worsening of his symptoms with increased frequency. No dysuria or gross hematuria. AUA score = 5. He was diagnosed with a UTI on 08/20/2020. He was started on Cipro. Urine culture showed no growth. He presented to the emergency room on 08/22/2020 with difficulty voiding. He did attempt self-catheterization with some bleeding. Bladder scan showed 568 mL in the bladder. A Foley catheter was placed. Urine culture showed no growth. His catheter was removed after a successful voiding trial on 08/28/2020. He was seen in  the emergency room on 09/04/2020 unable to void and unable to catheterize himself. A Foley catheter was placed with return of 600 mL.  He underwent a HoLEP on 11/11/2020 at the [facility]. Pathology showed single focus of atypical glands, no malignancy. PSA from 3/22: 0.5.  He was doing very well since the  procedure with significant improvement in his flow. AUA score = 2  SHIM = 3  Uroflow from 02/18/2021 showed a voided volume of 98 mL with a max flow rate of 16 mL/s and a residual of 18 mL.  At his visit in 12/23, he was doing very well from a urinary standpoint.  He was not having any significant urinary symptoms.  No dysuria or gross hematuria. IPSS = 0. He reported occasional erections.  He was not interested in any treatment at this time. PSA 12/23:  2.3  He returns today for scheduled follow-up.  He continues to do very well from a urinary standpoint.  He is not having any lower urinary tract symptoms.  No dysuria or gross hematuria. IPSS = 0 today  Portions of the above documentation were copied from a prior visit for review purposes only.   Past Medical History:  Past Medical History:  Diagnosis Date   Hypertension    Kidney stone     Past Surgical History:  Past Surgical History:  Procedure Laterality Date   HOLEP-LASER ENUCLEATION OF THE PROSTATE WITH MORCELLATION     KIDNEY STONE SURGERY  2016   with stent    Allergies:  Allergies  Allergen Reactions   Penicillins Hives    Family History:  History reviewed. No pertinent family history.  Social History:  Social History   Tobacco Use   Smoking status: Never   Smokeless tobacco: Never  Substance Use Topics   Alcohol use: Yes   Drug use: No    ROS: Constitutional:  Negative for fever, chills, weight loss CV: Negative for chest pain, previous MI, hypertension Respiratory:  Negative for shortness of breath, wheezing, sleep apnea, frequent cough GI:  Negative for nausea, vomiting, bloody stool, GERD  Physical exam: BP (!) 156/79   Pulse 63   Ht 5\' 6"  (1.676 m)   Wt 170 lb (77.1 kg)   BMI 27.44 kg/m  GENERAL APPEARANCE:  Well appearing, well developed, well nourished, NAD HEENT:  Atraumatic, normocephalic, oropharynx clear NECK:  Supple without lymphadenopathy or thyromegaly ABDOMEN:  Soft,  non-tender, no masses EXTREMITIES:  Moves all extremities well, without clubbing, cyanosis, or edema NEUROLOGIC:  Alert and oriented x 3, normal gait, CN II-XII grossly intact MENTAL STATUS:  appropriate BACK:  Non-tender to palpation, No CVAT SKIN:  Warm, dry, and intact GU: Prostate: 30 g, NT, no nodules Rectum: Normal tone,  no masses or tenderness   Results: U/A:  negative

## 2023-09-26 ENCOUNTER — Encounter: Payer: Self-pay | Admitting: Urology

## 2023-09-26 LAB — PSA: Prostate Specific Ag, Serum: 2.9 ng/mL (ref 0.0–4.0)

## 2023-12-14 ENCOUNTER — Other Ambulatory Visit: Payer: Self-pay

## 2023-12-14 ENCOUNTER — Emergency Department (HOSPITAL_BASED_OUTPATIENT_CLINIC_OR_DEPARTMENT_OTHER)
Admission: EM | Admit: 2023-12-14 | Discharge: 2023-12-14 | Disposition: A | Discharge status: Home / Self Care | Payer: Medicare Other | Attending: Emergency Medicine | Admitting: Emergency Medicine

## 2023-12-14 ENCOUNTER — Encounter (HOSPITAL_BASED_OUTPATIENT_CLINIC_OR_DEPARTMENT_OTHER): Payer: Self-pay | Admitting: Emergency Medicine

## 2023-12-14 ENCOUNTER — Emergency Department (HOSPITAL_BASED_OUTPATIENT_CLINIC_OR_DEPARTMENT_OTHER): Payer: Medicare Other

## 2023-12-14 DIAGNOSIS — S0181XA Laceration without foreign body of other part of head, initial encounter: Secondary | ICD-10-CM

## 2023-12-14 DIAGNOSIS — S01111A Laceration without foreign body of right eyelid and periocular area, initial encounter: Secondary | ICD-10-CM | POA: Diagnosis not present

## 2023-12-14 DIAGNOSIS — Z23 Encounter for immunization: Secondary | ICD-10-CM | POA: Diagnosis not present

## 2023-12-14 DIAGNOSIS — S0990XA Unspecified injury of head, initial encounter: Secondary | ICD-10-CM | POA: Diagnosis present

## 2023-12-14 DIAGNOSIS — W19XXXA Unspecified fall, initial encounter: Secondary | ICD-10-CM

## 2023-12-14 DIAGNOSIS — W010XXA Fall on same level from slipping, tripping and stumbling without subsequent striking against object, initial encounter: Secondary | ICD-10-CM | POA: Insufficient documentation

## 2023-12-14 MED ORDER — LIDOCAINE-EPINEPHRINE (PF) 2 %-1:200000 IJ SOLN
10.0000 mL | Freq: Once | INTRAMUSCULAR | Status: AC
  Administered 2023-12-14: 10 mL
  Filled 2023-12-14: qty 20

## 2023-12-14 MED ORDER — TETANUS-DIPHTH-ACELL PERTUSSIS 5-2.5-18.5 LF-MCG/0.5 IM SUSY
0.5000 mL | PREFILLED_SYRINGE | Freq: Once | INTRAMUSCULAR | Status: AC
  Administered 2023-12-14: 0.5 mL via INTRAMUSCULAR
  Filled 2023-12-14: qty 0.5

## 2023-12-14 NOTE — ED Provider Notes (Signed)
[place] [facility] Provider Note   CSN: [id number] Arrival date & time: 12/14/23  1619    History  Chief Complaint  Patient presents with   Fall   Facial Laceration    Travis Mitchell is a 78 y.o. male walking across the street and tripped. Mechanical fall. Fell onto face. Blepharoplasty last week. No LOC, anticoagulation. Tetanus due. No neck pain, back pain, numbness or weakness.  No pain or swelling to upper or lower extremities.  No pain, difficulty with vision.  1 in lac over right eyebrow  HPI     Home Medications Prior to Admission medications   Medication Sig Start Date End Date Taking? Authorizing Provider  atorvastatin (LIPITOR) 20 MG tablet Take by mouth. 03/20/18   [provider]  cholecalciferol (VITAMIN D3) 25 MCG (1000 UNIT) tablet  09/05/22   [provider]  olmesartan (BENICAR) 20 MG tablet Take 1 tablet by mouth daily. 06/17/14   [provider]      Allergies    Bee venom and Penicillins    Review of Systems   Review of Systems  Constitutional: Negative.   HENT: Negative.    Respiratory: Negative.    Cardiovascular: Negative.   Gastrointestinal: Negative.   Genitourinary: Negative.   Musculoskeletal: Negative.   Skin:  Positive for wound.  All other systems reviewed and are negative.   Physical Exam Updated Vital Signs BP 135/81   Pulse 73   Temp (!) 96.5 F (35.8 C) (Tympanic)   Resp 15   Ht 5\' 6"  (1.676 m)   Wt 77.1 kg   SpO2 99%   BMI 27.44 kg/m  Physical Exam Vitals and nursing note reviewed.  Constitutional:      General: He is not in acute distress.    Appearance: He is well-developed. He is not ill-appearing, toxic-appearing or diaphoretic.  HENT:     Head: Normocephalic. Laceration present.     Jaw: There is normal jaw occlusion.      Comments: Soft tissue swelling superior to right eyebrow approximately 3 cm x 2 cm. 3 cm laceration to the aspect right  eyebrow. Eyes:     Extraocular Movements: Extraocular movements intact.     Conjunctiva/sclera: Conjunctivae normal.     Pupils: Pupils are equal, round, and reactive to light.     Comments: PERRLA, full range of motion. Old yellow bruising surrounding bilateral eyes.  Surgical incision to about upper leg eyelids from recent blepharoplasty Full range of motion bilateral eyebrows, able to raise and furrow eyebrows BIL  Neck:     Trachea: Trachea and phonation normal.     Comments: Full range of motion, no midline tenderness Cardiovascular:     Rate and Rhythm: Normal rate and regular rhythm.     Pulses: Normal pulses.          Radial pulses are 2+ on the right side and 2+ on the left side.  Pulmonary:     Effort: Pulmonary effort is normal. No respiratory distress.     Breath sounds: Normal breath sounds and air entry.  Abdominal:     General: There is no distension.     Palpations: Abdomen is soft.  Musculoskeletal:        General: Normal range of motion.     Cervical back: Full passive range of motion without pain, normal range of motion and neck supple.     Comments: No midline C/T/L tenderness Full range of motion, no bony  tenderness over the upper and lower extremities.  Skin:    General: Skin is warm and dry.     Capillary Refill: Capillary refill takes less than 2 seconds.     Findings: Ecchymosis, signs of injury and laceration present.     Comments: 3 cm laceration right right  superior aspect upper eyelid  Neurological:     General: No focal deficit present.     Mental Status: He is alert and oriented to person, place, and time.     ED Results / Procedures / Treatments   Labs (all labs ordered are listed, but only abnormal results are displayed) Labs Reviewed - No data to display  EKG None  Radiology CT Maxillofacial Wo Contrast Result Date: 12/14/2023 CLINICAL DATA:  Larey Seat, right facial laceration EXAM: CT MAXILLOFACIAL WITHOUT CONTRAST TECHNIQUE: Multidetector  CT imaging of the maxillofacial structures was performed. Multiplanar CT image reconstructions were also generated. RADIATION DOSE REDUCTION: This exam was performed according to the departmental dose-optimization program which includes automated exposure control, adjustment of the mA and/or kV according to patient size and/or use of iterative reconstruction technique. COMPARISON:  None Available. FINDINGS: Osseous: No fracture or mandibular dislocation. No destructive process. Orbits: Negative. No traumatic or inflammatory finding. Sinuses: Partial opacification of the left sphenoid sinus. Remaining paranasal sinuses are clear. Soft tissues: Mild right periorbital soft tissue swelling. Remaining soft tissues are unremarkable. Limited intracranial: No significant or unexpected finding. IMPRESSION: 1. Minimal right periorbital soft tissue swelling. 2. No acute facial bone fracture. 3. Left sphenoid sinus disease. Electronically Signed   By: Sharlet Salina M.D.   On: 12/14/2023 18:59   CT Cervical Spine Wo Contrast Result Date: 12/14/2023 CLINICAL DATA:  Larey Seat, right facial laceration EXAM: CT CERVICAL SPINE WITHOUT CONTRAST TECHNIQUE: Multidetector CT imaging of the cervical spine was performed without intravenous contrast. Multiplanar CT image reconstructions were also generated. RADIATION DOSE REDUCTION: This exam was performed according to the departmental dose-optimization program which includes automated exposure control, adjustment of the mA and/or kV according to patient size and/or use of iterative reconstruction technique. COMPARISON:  None Available. FINDINGS: Alignment: Loss of cervical lordosis due to multilevel cervical degenerative changes. Otherwise alignment is anatomic. Skull base and vertebrae: No acute fracture. No primary bone lesion or focal pathologic process. Soft tissues and spinal canal: No prevertebral fluid or swelling. No visible canal hematoma. Disc levels: Multilevel facet hypertrophy  greatest from C2-3 through C4-5. Moderate spondylosis at C5-6 and C6-7. Upper chest: Airway is patent. Subpleural scarring at the right apex. Other: Reconstructed images demonstrate no additional findings. IMPRESSION: 1. No acute cervical spine fracture. 2. Multilevel cervical degenerative changes as above. Electronically Signed   By: Sharlet Salina M.D.   On: 12/14/2023 18:58   CT Head Wo Contrast Result Date: 12/14/2023 CLINICAL DATA:  Larey Seat 30 minutes ago, right facial laceration EXAM: CT HEAD WITHOUT CONTRAST TECHNIQUE: Contiguous axial images were obtained from the base of the skull through the vertex without intravenous contrast. RADIATION DOSE REDUCTION: This exam was performed according to the departmental dose-optimization program which includes automated exposure control, adjustment of the mA and/or kV according to patient size and/or use of iterative reconstruction technique. COMPARISON:  None Available. FINDINGS: Brain: No acute infarct or hemorrhage. Lateral ventricles and midline structures are unremarkable. There are no acute extra-axial fluid collections. CSF fluid collection within the posterior fossa may reflect arachnoid cyst. No mass effect. No midline shift. Vascular: No hyperdense vessel or unexpected calcification. Skull: Normal. Negative for fracture or focal  lesion. Sinuses/Orbits: Partial opacification of the left sphenoid sinus. Remaining paranasal sinuses are clear. Other: None. IMPRESSION: 1. No acute intracranial process. Electronically Signed   By: Sharlet Salina M.D.   On: 12/14/2023 18:56    Procedures .Laceration Repair  Date/Time: 12/14/2023 6:39 PM  Performed by: Linwood Dibbles, PA-C Authorized by: Linwood Dibbles, PA-C   Consent:    Consent obtained:  Verbal   Consent given by:  Patient   Risks discussed:  Infection, pain, retained foreign body, tendon damage, vascular damage, nerve damage, poor cosmetic result, need for additional repair and poor wound  healing   Alternatives discussed:  No treatment, delayed treatment, observation and referral Universal protocol:    Procedure explained and questions answered to patient or proxy's satisfaction: yes     Relevant documents present and verified: yes     Test results available: yes     Imaging studies available: yes     Required blood products, implants, devices, and special equipment available: yes     Site/side marked: yes     Immediately prior to procedure, a time out was called: yes     Patient identity confirmed:  Verbally with patient Anesthesia:    Anesthesia method:  Local infiltration   Local anesthetic:  Lidocaine 1% WITH epi Laceration details:    Location:  Face   Length (cm):  3   Depth (mm):  5 Pre-procedure details:    Preparation:  Patient was prepped and draped in usual sterile fashion and imaging obtained to evaluate for foreign bodies Exploration:    Limited defect created (wound extended): no     Hemostasis achieved with:  Direct pressure   Imaging obtained comment:  CT   Wound exploration: wound explored through full range of motion and entire depth of wound visualized     Wound extent: no signs of injury, no tendon damage, no underlying fracture and no vascular damage   Treatment:    Area cleansed with:  Povidone-iodine   Amount of cleaning:  Extensive   Irrigation solution:  Sterile saline   Undermining:  None   Scar revision: no   Skin repair:    Repair method:  Sutures   Suture size:  5-0   Suture material:  Prolene   Suture technique:  Simple interrupted   Number of sutures:  5 Approximation:    Approximation:  Close Repair type:    Repair type:  Intermediate Post-procedure details:    Dressing:  Open (no dressing)   Procedure completion:  Tolerated well, no immediate complications     Medications Ordered in ED Medications  lidocaine-EPINEPHrine (XYLOCAINE W/EPI) 2 %-1:200000 (PF) injection 10 mL (10 mLs Infiltration Given by Other 12/14/23  1717)  Tdap (BOOSTRIX) injection 0.5 mL (0.5 mLs Intramuscular Given 12/14/23 1715)    ED Course/ Medical Decision Making/ A&P   78 year old here for mechanical fall.  Walking cross a street, tripped and fell.  Landed on the right side of his face he has a 3 similar laceration to left superior aspect periorbital area.  Of note he did have recent blepharoplasty the bilateral eyes about 1 week ago.  His incisions appear C/D/I.  He has some bilateral old yellow bruising to bilateral eyes however no new raccoon eye, Battle sign.  No midline C/T/L tenderness.  Nontender bilateral lower extremities, specifically nontender bilateral hands, wrist, forearm.  Tetanus not up-to-date, will update  Imaging personally viewed interpreted:   See procedure note.  Patient tolerated well.  #5  sutures to superior aspect right eyebrow.  He has full range of motion to his facial musculature.  Low suspicion for deep laceration into the musculature.  No evidence of vascular injury, no pulsatile bleeding.  Will have him follow-up outpatient, return for any worsening symptoms.  The patient has been appropriately medically screened and/or stabilized in the ED. I have low suspicion for any other emergent medical condition which would require further screening, evaluation or treatment in the ED or require inpatient management.  Patient is hemodynamically stable and in no acute distress.  Patient able to ambulate in department prior to ED.  Evaluation does not show acute pathology that would require ongoing or additional emergent interventions while in the emergency department or further inpatient treatment.  I have discussed the diagnosis with the patient and answered all questions.  Pain is been managed while in the emergency department and patient has no further complaints prior to discharge.  Patient is comfortable with plan discussed in room and is stable for discharge at this time.  I have discussed strict return precautions  for returning to the emergency department.  Patient was encouraged to follow-up with PCP/specialist refer to at discharge.                                 Medical Decision Making Amount and/or Complexity of Data Reviewed Independent Historian: spouse External Data Reviewed: labs, radiology and notes. Radiology: ordered and independent interpretation performed. Decision-making details documented in ED Course.  Risk OTC drugs. Prescription drug management. Decision regarding hospitalization. Diagnosis or treatment significantly limited by social determinants of health.          Final Clinical Impression(s) / ED Diagnoses Final diagnoses:  Fall, initial encounter  Facial laceration, initial encounter    Rx / DC Orders ED Discharge Orders     None         Versie Fleener A, PA-C 12/14/23 1904    Pricilla Loveless, MD 12/14/23 2257

## 2023-12-14 NOTE — ED Notes (Signed)
Patient given discharge instructions. Questions were answered. Patient verbalized understanding of discharge instructions and care at home.  Discharged with family 

## 2023-12-14 NOTE — ED Triage Notes (Signed)
Pt POV steady gait- mechanical fall appx 30 min PTA, denies LOC, denies blood thinners.   Laceration above R eyebrow, small amount of bleeding at time of triage.

## 2023-12-14 NOTE — Discharge Instructions (Addendum)
It was a pleasure taking care of you here today.  Your CT scan was reassuring.  You have #5 sutures placed to your right eyebrow.  These will need to be removed in about 5 to 7 days.  You will most likely get increased swelling to this area.  I do recommend ice to help with the swelling.  If you have bruising I would anticipate this will likely start to migrate down your face which is normal.  Tylenol or Motrin for pain  Follow-up outpatient, return for any worsening symptoms

## 2023-12-14 NOTE — ED Notes (Signed)
 Patient transported to X-ray

## 2024-09-24 ENCOUNTER — Encounter: Payer: Self-pay | Admitting: Urology

## 2024-09-24 ENCOUNTER — Ambulatory Visit: Payer: Medicare Other | Admitting: Urology

## 2024-09-24 VITALS — BP 129/71 | HR 61 | Ht 66.0 in | Wt 170.0 lb

## 2024-09-24 DIAGNOSIS — N138 Other obstructive and reflux uropathy: Secondary | ICD-10-CM | POA: Diagnosis not present

## 2024-09-24 DIAGNOSIS — N401 Enlarged prostate with lower urinary tract symptoms: Secondary | ICD-10-CM

## 2024-09-24 DIAGNOSIS — Z87438 Personal history of other diseases of male genital organs: Secondary | ICD-10-CM | POA: Diagnosis not present

## 2024-09-24 DIAGNOSIS — Z87898 Personal history of other specified conditions: Secondary | ICD-10-CM

## 2024-09-24 LAB — URINALYSIS, ROUTINE W REFLEX MICROSCOPIC
Bilirubin, UA: NEGATIVE
Glucose, UA: NEGATIVE
Ketones, UA: NEGATIVE
Leukocytes,UA: NEGATIVE
Nitrite, UA: NEGATIVE
Protein,UA: NEGATIVE
RBC, UA: NEGATIVE
Specific Gravity, UA: 1.015 (ref 1.005–1.030)
Urobilinogen, Ur: 0.2 mg/dL (ref 0.2–1.0)
pH, UA: 7.5 (ref 5.0–7.5)

## 2024-09-24 NOTE — Progress Notes (Signed)
 Assessment: 1. BPH with obstruction/lower urinary tract symptoms   2. History of elevated PSA     Plan: PSA today. Return to office in 1 year.  Chief Complaint:  Chief Complaint  Patient presents with   Benign Prostatic Hypertrophy    History of Present Illness:  Travis Mitchell is a 78 y.o. male who is seen for continued evaluation of BPH with obstruction and lower urinary tract symptoms, microscopic hematuria, elevated PSA, and organic impotence.    Urologic History: He had a episode of urinary retention in 2/18. He was started on alfuzosin and passed a voiding trial. Urine culture grew >100K Lactobacillus.   He has a history of elevated PSA. He is been followed for an elevated PSA and is status post several prostate biopsies. 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 His prostate biopsy in 7/11 for PSA of 17.45 showed BPH with no malignancy. His PSA has been as high as 17-18. He was evaluated with an MRI in January 2015 which showed no obvious abnormalities within the prostate. His PSA was 12.59 with 15% free from 06/15/14. His PSA from 1/16 was 17.58. 4K test from 10/16 showed PSA of 13.63, 16% free PSA, and 22% probability of aggressive prostate cancer on biopsy. He underwent a prostate biopsy on 09/07/15. Prostate volume measured 125 mL. Pathology showed benign prostatic tissue, no evidence of malignancy. 4K test from 4/18 showed a PSA of 21.09, free PSA 18%, and 38% probability of aggressive prostate cancer on biopsy. He underwent MRI of the prostate in May 2018 which showed no suspicious lesions.  He was started on finasteride. PSA from 3/19 was 10.6 with 21% free. PSA from 12/19 was 5.15 (11.02 corrected) with 17% free. PSA from 7/20 was 6.97 (13.9 corrected) with 18% free. PSA from 1/21 was 12.29 with 18% free. 4K test from 07/08/2020 showed a PSA of 28.33 with 11% free PSA and a 31% probability of aggressive prostate cancer on biopsy.  MRI from  09/12/2020 showed a markedly enlarged prostate compatible with BPH with a large median lobe extending into the bladder, no signs of high risk lesions within the prostate. Prostate volume measures 150 mL.  He continued on alfuzosin and finasteride. AUA score = 3. U/A from 2/19 showed >30 RBCs. Repeat U/A negative. Recent U/A from 5/19 showed 23-30 RBCs. He underwent a hematuria evaluation in 6/19. CT imaging showed a 6 mm left distal ureteral calculus without obstruction and a 2 mm right renal calculus. Cystoscopy showed lateral lobe enlargement of the prostate and no bladder abnormalities. He underwent a left ureteroscopic stone manipulation with laser lithotripsy in [place]  on 04/29/2018. He developed acute urinary retention postoperatively requiring Foley catheter placement. He did require self intermittent catheterization for a short period of time but was eventually voiding spontaneously. He discontinued catheterizing.   He discontinued the finasteride in May due to gynecomastia. His gynecomastia resolved.  At his visit in 9/21, he continued on alfuzosin but reported some slight worsening of his symptoms with increased frequency. No dysuria or gross hematuria. AUA score = 5. He was diagnosed with a UTI on 08/20/2020. He was started on Cipro. Urine culture showed no growth. He presented to the emergency room on 08/22/2020 with difficulty voiding. He did attempt self-catheterization with some bleeding. Bladder scan showed 568 mL in the bladder. A Foley catheter was placed. Urine culture showed no growth. His catheter was removed after a successful voiding trial on 08/28/2020. He was seen in  the emergency room on 09/04/2020 unable to void and unable to catheterize himself. A Foley catheter was placed with return of 600 mL.  He underwent a HoLEP on 11/11/2020 at the [facility]. Pathology showed single focus of atypical glands, no malignancy. PSA from 3/22: 0.5.  He was doing very well since the  procedure with significant improvement in his flow. AUA score = 2  SHIM = 3  Uroflow from 02/18/2021 showed a voided volume of 98 mL with a max flow rate of 16 mL/s and a residual of 18 mL.  At his visit in 12/23, he was doing very well from a urinary standpoint.  He was not having any significant urinary symptoms.  No dysuria or gross hematuria. IPSS = 0. He reported occasional erections.  He was not interested in any treatment at this time. PSA 12/23:  2.3  At his visit in 12/24, he continued to do very well from a urinary standpoint.  He was not having any lower urinary tract symptoms.  No dysuria or gross hematuria. IPSS = 0. PSA 12/24:  2.9  He returns today for follow-up.  He continues to do extremely well from a urinary standpoint.  No lower urinary tract symptoms.  No dysuria or gross hematuria. IPSS = 0.  Portions of the above documentation were copied from a prior visit for review purposes only.   Past Medical History:  Past Medical History:  Diagnosis Date   Hypertension    Kidney stone     Past Surgical History:  Past Surgical History:  Procedure Laterality Date   HOLEP-LASER ENUCLEATION OF THE PROSTATE WITH MORCELLATION     KIDNEY STONE SURGERY  2016   with stent    Allergies:  Allergies  Allergen Reactions   Bee Venom    Penicillins Hives    Family History:  No family history on file.  Social History:  Social History   Tobacco Use   Smoking status: Never   Smokeless tobacco: Never  Substance Use Topics   Alcohol use: Yes   Drug use: No    ROS: Constitutional:  Negative for fever, chills, weight loss CV: Negative for chest pain, previous MI, hypertension Respiratory:  Negative for shortness of breath, wheezing, sleep apnea, frequent cough GI:  Negative for nausea, vomiting, bloody stool, GERD  Physical exam: BP 129/71   Pulse 61   Ht 5' 6 (1.676 m)   Wt 170 lb (77.1 kg)   BMI 27.44 kg/m  GENERAL APPEARANCE:  Well appearing, well  developed, well nourished, NAD HEENT:  Atraumatic, normocephalic, oropharynx clear NECK:  Supple without lymphadenopathy or thyromegaly ABDOMEN:  Soft, non-tender, no masses EXTREMITIES:  Moves all extremities well, without clubbing, cyanosis, or edema NEUROLOGIC:  Alert and oriented x 3, normal gait, CN II-XII grossly intact MENTAL STATUS:  appropriate BACK:  Non-tender to palpation, No CVAT SKIN:  Warm, dry, and intact GU: Prostate: 40 g, NT, no nodules Rectum: Normal tone,  no masses or tenderness    Results: U/A: negative

## 2024-09-25 ENCOUNTER — Ambulatory Visit: Payer: Self-pay | Admitting: Urology

## 2024-09-25 LAB — PSA: Prostate Specific Ag, Serum: 3 ng/mL (ref 0.0–4.0)

## 2025-09-24 ENCOUNTER — Ambulatory Visit: Admitting: Urology
